# Patient Record
Sex: Female | Born: 1950 | Race: White | Hispanic: No | Marital: Married | Smoking: Never smoker
Health system: Southern US, Community
[De-identification: ages and names within clinical notes are randomized; demographics above are authoritative.]

## PROBLEM LIST (undated history)

## (undated) DIAGNOSIS — I10 Essential (primary) hypertension: Secondary | ICD-10-CM

## (undated) DIAGNOSIS — E119 Type 2 diabetes mellitus without complications: Secondary | ICD-10-CM

## (undated) DIAGNOSIS — E7800 Pure hypercholesterolemia, unspecified: Secondary | ICD-10-CM

## (undated) DIAGNOSIS — M1990 Unspecified osteoarthritis, unspecified site: Secondary | ICD-10-CM

## (undated) DIAGNOSIS — J302 Other seasonal allergic rhinitis: Secondary | ICD-10-CM

## (undated) DIAGNOSIS — G4730 Sleep apnea, unspecified: Secondary | ICD-10-CM

## (undated) HISTORY — DX: Essential (primary) hypertension: I10

## (undated) HISTORY — DX: Unspecified osteoarthritis, unspecified site: M19.90

## (undated) HISTORY — DX: Type 2 diabetes mellitus without complications: E11.9

## (undated) HISTORY — DX: Other seasonal allergic rhinitis: J30.2

## (undated) HISTORY — PX: PARTIAL HYSTERECTOMY: SHX80

## (undated) HISTORY — PX: TUBAL LIGATION: SHX77

## (undated) HISTORY — DX: Sleep apnea, unspecified: G47.30

## (undated) HISTORY — PX: CORONARY ANGIOPLASTY WITH STENT PLACEMENT: SHX49

## (undated) HISTORY — PX: NASAL SINUS SURGERY: SHX719

## (undated) HISTORY — DX: Pure hypercholesterolemia, unspecified: E78.00

---

## 1997-10-14 ENCOUNTER — Other Ambulatory Visit: Admission: RE | Admit: 1997-10-14 | Discharge: 1997-10-14 | Payer: Self-pay | Admitting: *Deleted

## 1997-11-21 ENCOUNTER — Ambulatory Visit (HOSPITAL_BASED_OUTPATIENT_CLINIC_OR_DEPARTMENT_OTHER): Admission: RE | Admit: 1997-11-21 | Discharge: 1997-11-21 | Payer: Self-pay | Admitting: *Deleted

## 1998-07-25 ENCOUNTER — Ambulatory Visit (HOSPITAL_BASED_OUTPATIENT_CLINIC_OR_DEPARTMENT_OTHER): Admission: RE | Admit: 1998-07-25 | Discharge: 1998-07-25 | Payer: Self-pay | Admitting: General Surgery

## 1998-08-08 ENCOUNTER — Emergency Department (HOSPITAL_COMMUNITY): Admission: EM | Admit: 1998-08-08 | Discharge: 1998-08-09 | Payer: Self-pay | Admitting: Emergency Medicine

## 1998-08-08 ENCOUNTER — Encounter: Payer: Self-pay | Admitting: Emergency Medicine

## 1998-08-09 ENCOUNTER — Encounter: Payer: Self-pay | Admitting: Emergency Medicine

## 1999-01-25 ENCOUNTER — Emergency Department (HOSPITAL_COMMUNITY): Admission: EM | Admit: 1999-01-25 | Discharge: 1999-01-25 | Payer: Self-pay | Admitting: *Deleted

## 2007-12-10 ENCOUNTER — Emergency Department (HOSPITAL_BASED_OUTPATIENT_CLINIC_OR_DEPARTMENT_OTHER): Admission: EM | Admit: 2007-12-10 | Discharge: 2007-12-10 | Payer: Self-pay | Admitting: Emergency Medicine

## 2009-01-26 ENCOUNTER — Emergency Department (HOSPITAL_BASED_OUTPATIENT_CLINIC_OR_DEPARTMENT_OTHER): Admission: EM | Admit: 2009-01-26 | Discharge: 2009-01-26 | Payer: Self-pay | Admitting: Emergency Medicine

## 2009-09-28 ENCOUNTER — Encounter: Admission: RE | Admit: 2009-09-28 | Discharge: 2009-09-28 | Payer: Self-pay | Admitting: Specialist

## 2010-08-17 LAB — CBC
Hemoglobin: 12.1 g/dL (ref 12.0–15.0)
MCHC: 35.3 g/dL (ref 30.0–36.0)
RBC: 3.69 MIL/uL — ABNORMAL LOW (ref 3.87–5.11)
WBC: 8 10*3/uL (ref 4.0–10.5)

## 2010-08-17 LAB — DIFFERENTIAL
Lymphocytes Relative: 14 % (ref 12–46)
Lymphs Abs: 1.1 10*3/uL (ref 0.7–4.0)
Monocytes Absolute: 0.7 10*3/uL (ref 0.1–1.0)
Monocytes Relative: 9 % (ref 3–12)
Neutro Abs: 5.8 10*3/uL (ref 1.7–7.7)

## 2010-08-17 LAB — BASIC METABOLIC PANEL
CO2: 22 mEq/L (ref 19–32)
Calcium: 9.6 mg/dL (ref 8.4–10.5)
GFR calc Af Amer: 56 mL/min — ABNORMAL LOW (ref >60)
Sodium: 145 mEq/L (ref 135–145)

## 2010-08-17 LAB — GLUCOSE, CAPILLARY: Glucose-Capillary: 119 mg/dL — ABNORMAL HIGH (ref 70–99)

## 2010-10-09 ENCOUNTER — Encounter (HOSPITAL_COMMUNITY)
Admission: RE | Admit: 2010-10-09 | Discharge: 2010-10-09 | Disposition: A | Discharge status: Home / Self Care | Payer: 59 | Source: Ambulatory Visit | Attending: Orthopedic Surgery | Admitting: Orthopedic Surgery

## 2010-10-09 LAB — DIFFERENTIAL
Lymphocytes Relative: 33 % (ref 12–46)
Monocytes Absolute: 0.3 10*3/uL (ref 0.1–1.0)
Monocytes Relative: 6 % (ref 3–12)
Neutro Abs: 3 10*3/uL (ref 1.7–7.7)
Neutrophils Relative %: 58 % (ref 43–77)

## 2010-10-09 LAB — SURGICAL PCR SCREEN: MRSA, PCR: NEGATIVE

## 2010-10-09 LAB — URINALYSIS, ROUTINE W REFLEX MICROSCOPIC
Bilirubin Urine: NEGATIVE
Glucose, UA: NEGATIVE mg/dL
Hgb urine dipstick: NEGATIVE
Specific Gravity, Urine: 1.015 (ref 1.005–1.030)
pH: 5.5 (ref 5.0–8.0)

## 2010-10-09 LAB — APTT: aPTT: 29 seconds (ref 24–37)

## 2010-10-09 LAB — BASIC METABOLIC PANEL
CO2: 23 mEq/L (ref 19–32)
Calcium: 9.4 mg/dL (ref 8.4–10.5)
Chloride: 106 mEq/L (ref 96–112)
Creatinine, Ser: 1.16 mg/dL (ref 0.4–1.2)
GFR calc Af Amer: 58 mL/min — ABNORMAL LOW (ref >60)
Glucose, Bld: 131 mg/dL — ABNORMAL HIGH (ref 70–99)
Sodium: 140 mEq/L (ref 135–145)

## 2010-10-09 LAB — CBC
HCT: 34.1 % — ABNORMAL LOW (ref 36.0–46.0)
Hemoglobin: 11.4 g/dL — ABNORMAL LOW (ref 12.0–15.0)
MCH: 30.1 pg (ref 26.0–34.0)
MCHC: 33.4 g/dL (ref 30.0–36.0)
RBC: 3.79 MIL/uL — ABNORMAL LOW (ref 3.87–5.11)

## 2010-10-09 LAB — PROTIME-INR: INR: 0.96 (ref 0.00–1.49)

## 2010-10-10 ENCOUNTER — Ambulatory Visit (HOSPITAL_COMMUNITY): Payer: 59

## 2010-10-10 ENCOUNTER — Ambulatory Visit (HOSPITAL_COMMUNITY)
Admission: RE | Admit: 2010-10-10 | Discharge: 2010-10-12 | DRG: 497 | Disposition: A | Discharge status: Home / Self Care | Payer: 59 | Source: Ambulatory Visit | Attending: Orthopedic Surgery | Admitting: Orthopedic Surgery

## 2010-10-10 DIAGNOSIS — I10 Essential (primary) hypertension: Secondary | ICD-10-CM | POA: Insufficient documentation

## 2010-10-10 DIAGNOSIS — Z01812 Encounter for preprocedural laboratory examination: Secondary | ICD-10-CM | POA: Insufficient documentation

## 2010-10-10 DIAGNOSIS — G4733 Obstructive sleep apnea (adult) (pediatric): Secondary | ICD-10-CM | POA: Insufficient documentation

## 2010-10-10 DIAGNOSIS — K219 Gastro-esophageal reflux disease without esophagitis: Secondary | ICD-10-CM | POA: Insufficient documentation

## 2010-10-10 DIAGNOSIS — E669 Obesity, unspecified: Secondary | ICD-10-CM | POA: Insufficient documentation

## 2010-10-10 DIAGNOSIS — E119 Type 2 diabetes mellitus without complications: Secondary | ICD-10-CM | POA: Insufficient documentation

## 2010-10-10 DIAGNOSIS — S43439A Superior glenoid labrum lesion of unspecified shoulder, initial encounter: Secondary | ICD-10-CM | POA: Insufficient documentation

## 2010-10-10 DIAGNOSIS — X58XXXA Exposure to other specified factors, initial encounter: Secondary | ICD-10-CM | POA: Insufficient documentation

## 2010-10-10 DIAGNOSIS — Z794 Long term (current) use of insulin: Secondary | ICD-10-CM | POA: Insufficient documentation

## 2010-10-10 DIAGNOSIS — Z9861 Coronary angioplasty status: Secondary | ICD-10-CM | POA: Insufficient documentation

## 2010-10-10 DIAGNOSIS — I2510 Atherosclerotic heart disease of native coronary artery without angina pectoris: Secondary | ICD-10-CM | POA: Insufficient documentation

## 2010-10-10 DIAGNOSIS — S43429A Sprain of unspecified rotator cuff capsule, initial encounter: Secondary | ICD-10-CM | POA: Insufficient documentation

## 2010-10-10 DIAGNOSIS — M19019 Primary osteoarthritis, unspecified shoulder: Secondary | ICD-10-CM | POA: Insufficient documentation

## 2010-10-10 LAB — GLUCOSE, CAPILLARY
Glucose-Capillary: 74 mg/dL (ref 70–99)
Glucose-Capillary: 145 mg/dL — ABNORMAL HIGH (ref 70–99)
Glucose-Capillary: 113 mg/dL — ABNORMAL HIGH (ref 70–99)

## 2010-10-11 LAB — CBC
Hemoglobin: 10.2 g/dL — ABNORMAL LOW (ref 12.0–15.0)
Platelets: 219 10*3/uL (ref 150–400)
RBC: 3.45 MIL/uL — ABNORMAL LOW (ref 3.87–5.11)
WBC: 6 10*3/uL (ref 4.0–10.5)

## 2010-10-11 LAB — BASIC METABOLIC PANEL
CO2: 22 mEq/L (ref 19–32)
Chloride: 104 mEq/L (ref 96–112)
GFR calc Af Amer: >60 mL/min (ref >60)
Potassium: 3.8 mEq/L (ref 3.5–5.1)
Sodium: 135 mEq/L (ref 135–145)

## 2010-10-11 LAB — GLUCOSE, CAPILLARY
Glucose-Capillary: 102 mg/dL — ABNORMAL HIGH (ref 70–99)
Glucose-Capillary: 85 mg/dL (ref 70–99)
Glucose-Capillary: 82 mg/dL (ref 70–99)
Glucose-Capillary: 167 mg/dL — ABNORMAL HIGH (ref 70–99)
Glucose-Capillary: 117 mg/dL — ABNORMAL HIGH (ref 70–99)

## 2010-10-12 LAB — GLUCOSE, CAPILLARY: Glucose-Capillary: 116 mg/dL — ABNORMAL HIGH (ref 70–99)

## 2010-11-30 NOTE — Discharge Summary (Signed)
  NAME:  [name], [name] NO.:  [id number]  MEDICAL RECORD NO.:  [id number]           PATIENT TYPE:  I  LOCATION:  5004                         FACILITY:  [facility]  PHYSICIAN:  Almedia Balls. Ranell Patrick, M.D. DATE OF BIRTH:  02-Jan-1951  DATE OF ADMISSION:  10/10/2010 DATE OF DISCHARGE:  10/11/2010                              DISCHARGE SUMMARY   ADMISSION DIAGNOSIS:  Right shoulder pain secondary to rotator cuff tear.  DISCHARGE DIAGNOSIS:  Right shoulder pain secondary to rotator cuff tear, status post open repair.  BRIEF HISTORY:  The patient is a 60 year old female with worsening right shoulder pain secondary to right rotator cuff tear and SLAP lesion.  The patient is elected for surgical management, decrease pain and increase function.  PROCEDURE:  The patient had a right shoulder arthroscopy, rotator cuff repair along with biceps tenodesis by Dr. Malon Kindle on Oct 10, 2010. Assistant was [employer], general anesthesia was used.  No complications.  HOSPITAL COURSE:  The patient admitted on Oct 10, 2010, for the above- stated procedure which she tolerated well.  After adequate time in Post Anesthesia Care Unit, she was transferred to 5000.  Postop day #1, the patient complained about moderate nausea and block was still actually working quite well on the morning, but her pain was under adequate control, otherwise she was tolerating postsurgical time fairly well. She worked with physical therapy before discharge and was discharged home on postop day #1.  DISCHARGE/PLAN:  The patient will be discharged home on Oct 11, 2010. Her condition is stable.  Her diet is regular.  DISCHARGE MEDICATIONS: 1. Percocet 5/325 one to two tabs q.4-6 hours p.r.n. pain. 2. Robaxin 500 mg p.o. q.6 h. 3. Aspirin 81 mg daily. 4. Symbicort 2 puffs inhaled b.i.d. 5. Carvedilol 25 mg b.i.d. 6. Flonase 2 sprays nightly. 7. Hydralazine 25 mg b.i.d. insulin per sliding scale. 8.  Isosorbide mononitrate 60 mg daily. 9. Cozaar 100 mg daily. 10.Metformin 1000 mg b.i.d. 11.Robaxin 500 mg q.6. 12.Protonix 40 mg daily. 13.Crestor 10 mg nightly. 14.Spironolactone 25 mg daily.  FOLLOWUP:  The patient is to follow back up with Dr. Malon Kindle in 2 weeks.     Thomas B. Dixon, P.A.   ______________________________ Almedia Balls. Ranell Patrick, M.D.    TBD/MEDQ  D:  10/11/2010  T:  10/11/2010  Job:  [id number]  Electronically Signed by Standley Dakins P.A. on 11/06/2010 08:31:35 AM Electronically Signed by Malon Kindle  on 11/30/2010 12:04:32 AM

## 2010-11-30 NOTE — Op Note (Signed)
NAME:  [name] NO.:  [id number]  MEDICAL RECORD NO.:  [id number]           PATIENT TYPE:  I  LOCATION:  5004                         FACILITY:  [facility]  PHYSICIAN:  [name], M.D. DATE OF BIRTH:  1950-12-22  DATE OF PROCEDURE:  10/10/2010 DATE OF DISCHARGE:                              OPERATIVE REPORT   PREOPERATIVE DIAGNOSES:  Right shoulder rotator cuff tear, SLAP lesion, and AC joint arthritis.  POSTOPERATIVE DIAGNOSES:  Right shoulder rotator cuff tear, SLAP lesion, and AC joint arthritis.  PROCEDURE PERFORMED:  Right shoulder arthroscopy with extensive intraarticular debridement including debridement of torn superior labrum, anterior to posterior arthroscopic biceps tenotomy, arthroscopic subacromial decompression, and acromioplasty followed by mini open rotator cuff repair, open biceps tenodesis in the groove, and open distal clavicle resection.  SURGEON:  Karen Balls. Ranell Patrick, MD  ASSISTANT:  [name]. Dixon, PA-C  General anesthesia was used plus interscalene block.  ESTIMATED BLOOD LOSS:  Minimal.  FLUID REPLACEMENT:  1200 mL crystalloids.  COUNTS:  Correct.  There were no complications.  Preoperative antibiotics were given.  INDICATIONS:  The patient is a 60 year old female with worsening right shoulder pain secondary to MRI-documented torn rotator cuff.  The patient also has advanced degenerative changes in the superior labrum concerning for tearing and also AC joint arthritis without impingement on the rotator cuff.  Having failed conservative management desiring operative treatment, the patient has elected to proceed with surgery to restore function and limit pain to her shoulder.  Informed consent was obtained.  DESCRIPTION OF PROCEDURE:  After an adequate level of anesthesia achieved, the patient was positioned in modified beach-chair position. Right shoulder exam under anesthesia, full passive range of motion  was noted with no undue stiffness and no instability.  A sterile prep and drape was performed of the right shoulder.  Time-out was called.  Right shoulder was then entered arthroscopically using standard portals including anterior, posterior, and lateral portals.  Identified a severely torn rotator cuff involving supraspinatus, infraspinatus tendons, retracted to the mid joint.  We also noted there to be degenerative superior labral tear anterior to posterior with unstable biceps.  We performed a biceps tenotomy and labral debridement back to stable labral rim.  Articular cartilage in the glenohumeral joint showed minimal degenerative changes.  Subscapularis appeared fairly normal. The rotator cuff was torn as mentioned.  Posterior cuff in the teres minor area and part of the infraspinatus was intact.  We then placed the scope in subacromial space.  A thorough bursectomy and acromioplasty was performed creating a type 1 acromial shape with attention towards removing the lateral overhang.  Once the rotator cuff decompression was complete, we inspected the cuff again.  This was a fairly degenerative looking tear, wide at least 2-3 cm in length and retraction of the mid joint.  We then went ahead to conclude the arthroscopic portion of surgery, made a saber incision overlying the AC joint.  Dissection down through subcu tissues using Bovie.  Subperiosteal dissection distal clavicle followed and that was splitting in deltotrapezial fascia lying within the clavicle.  Once we  had subperiosteally dissected, we removed about 3 mm of distal clavicle using oscillating saw.  Thoroughly irrigated the [facility] interval followed by application of bone wax cutting the clavicle.  We then went ahead and repaired the deltotrapezial fascia with 0 Vicryl figure-of-eight interrupted suture followed by 2-0 Vicryl subcutaneous closure and 4-0 Monocryl for skin.  We then addressed rotator cuff and biceps to a single  mini open incision, starting at the anterolateral border of the acromion staying distally about 3 to 4 cm. Dissection down through subcutaneous tissue, split the deltoid line with its fibers and between the anterolateral heads.  We identified the biceps tendon.  We retracted that on tension and then went ahead and placed a single zero #2 FiberWire suture through the biceps and through the soft tissue in a figure-of-eight stitch to obtain a descent soft tissue tenodesis on tension.  We then removed the remaining tendon. Next, we went ahead through rest of rotator cuff tear.  This was a U- shaped tear.  We went ahead and performed a repair of this.  We mobilized the tendon, freshened up the rotator cuff footprint, placed two 5.5 PEEK Bio-Corkscrew anchors by [employer] at the articular margin, double loaded with #2 FiberWire suture.  We anchored the anterior and posterior corners with #2 FiberWire simple suture.  Once we had brought that tendon back in anatomic position and tied those suture anchor sutures, we additionally took a mattress suture over the top of the entire repair down to 2 push locks out laterally.  We used some of the suture material that we had tied from the suture anchors and brought that down over the top the lateral footprint with spreading these sutures.  We had nice application of the lateral portion of the rotator cuff footprint.  We also did some sutures through bone as her bone was very soft with 0 Vicryl suture, using a wedge suture needle to dry the suture through the bone, and we had basically an anatomic repair, not under tension that did not gap at all through a full range of motion nor did it impinge.  We thoroughly irrigated the repair site.  We were very pleased with how that turned out, took my finger and rubbed it smoothly over the repair area rotating the humeral head under my finger and again we almost could not tell the repaired area from the normal area  in terms of the way it felt and was palpated.  At this point, we went ahead and thoroughly irrigated and closed the deltoid to itself with 0 Vicryl suture followed by 2-0 Vicryl for subcutaneous closure and 4-0 Monocryl for skin and portals.  Steri-Strips applied followed by sterile dressing and shoulder sling.     [name], M.D.     SRN/MEDQ  D:  10/10/2010  T:  10/11/2010  Job:  [id number]  Electronically Signed by Malon Kindle  on 11/30/2010 12:04:29 AM

## 2014-08-26 ENCOUNTER — Ambulatory Visit (INDEPENDENT_AMBULATORY_CARE_PROVIDER_SITE_OTHER): Payer: Medicare Other | Admitting: Internal Medicine

## 2014-08-26 ENCOUNTER — Encounter: Payer: Self-pay | Admitting: Internal Medicine

## 2014-08-26 VITALS — BP 144/98 | HR 89 | Ht 64.0 in | Wt 249.0 lb

## 2014-08-26 DIAGNOSIS — M069 Rheumatoid arthritis, unspecified: Secondary | ICD-10-CM | POA: Diagnosis not present

## 2014-08-26 DIAGNOSIS — R0600 Dyspnea, unspecified: Secondary | ICD-10-CM | POA: Diagnosis not present

## 2014-08-26 DIAGNOSIS — R0689 Other abnormalities of breathing: Secondary | ICD-10-CM

## 2014-08-26 NOTE — Progress Notes (Signed)
Subjective:    Patient ID: [name], female    DOB: [birth year], 64 y.o.   MRN: [id number] PCP Mariel Sleet, PA-C  Referred by Dr. Zenovia Jordan rheumatology [facility]  HPI  IOV 08/26/2014  Chief Complaint  Patient presents with  . Pulmonary consult    Pt c/o left sided chest pain, chest tightness/restriction, SOB with activity. Denies any wheezing, cough. Uses CPAP nightly.  Heart Cath done on 07/20/14.   64 year old obese female with rheumatoid arthritis. Referred for dyspnea. Reports a diagnosis of rheumatoid arthritis in 2013/2014. Since then she's been on methotrexate weekly and TNF alpha blockade. Reporting insidious onset of shortness of breath for the last few to several months. It is moderate in severity. In fact when she walked 600 feet in office she got extremely dyspneic. However she did not desaturate. Dyspnea relieved by rest. No associated cough or wheezing or chest pain although there is some random chest tightness. She did undergo extensive cardiac workup including echocardiogram nuclear medicine stress test test and left heart catheterization by her cardiologist in [facility] and apparently all this was noncontributory. It does not look like she had a right heart catheterization.   Walking desaturation test 185 feet 3 laps. Resting heart rate was 82/m peak heart rate was 111/m. She did not desaturate below 97%.    has a past medical history of High blood pressure; Diabetes; High cholesterol; Seasonal allergies; Sleep apnea; and Osteoarthritis.   reports that she has never smoked. She does not have any smokeless tobacco history on file.  Past Surgical History  Procedure Laterality Date  . Nasal sinus surgery    . Coronary angioplasty with stent placement    . Partial hysterectomy    . Tubal ligation      Allergies  Allergen Reactions  . Butorphanol Anaphylaxis  . Benazepril Hcl     Other reaction(s): Cough Per pt,  Lotensin never gave anaphylaxis, only a persistent cough, record updated to reflect this.  . Meloxicam     Bone marrow insufficiency  . Morphine And Related Itching    Immunization History  Administered Date(s) Administered  . Influenza Split 05/13/2014    Family History  Problem Relation Age of Onset  . Heart disease Mother   . Breast cancer Mother   . Lung cancer Brother      Current outpatient prescriptions:  .  aspirin 325 MG tablet, Take 325 mg by mouth daily., Disp: , Rfl:  .  atorvastatin (LIPITOR) 20 MG tablet, Take 20 mg by mouth daily., Disp: , Rfl:  .  Beclomethasone Dipropionate 80 MCG/ACT AERS, Place 1 spray into the nose., Disp: , Rfl:  .  carvedilol (COREG) 25 MG tablet, Take 25 mg by mouth 2 (two) times daily., Disp: , Rfl:  .  cetirizine (ZYRTEC) 5 MG tablet, Take 10 mg by mouth daily., Disp: , Rfl:  .  cloNIDine (CATAPRES) 0.1 MG tablet, Take 0.1 mg by mouth., Disp: , Rfl:  .  etanercept (ENBREL) 50 MG/ML injection, 50 mg., Disp: , Rfl:  .  fenofibrate micronized (LOFIBRA) 134 MG capsule, Take 134 mg by mouth., Disp: , Rfl:  .  folic acid (FOLVITE) 1 MG tablet, Take 1 mg by mouth., Disp: , Rfl:  .  hydrALAZINE (APRESOLINE) 25 MG tablet, TAKE 1 TABLET (25 MG TOTAL) BY MOUTH 3 (THREE) TIMES A DAY., Disp: , Rfl:  .  Insulin Glargine (LANTUS) 100 UNIT/ML Solostar Pen, 15 units nightly, Disp: ,  Rfl:  .  isosorbide mononitrate (IMDUR) 60 MG 24 hr tablet, Take 60 mg by mouth., Disp: , Rfl:  .  losartan (COZAAR) 100 MG tablet, Take 100 mg by mouth., Disp: , Rfl:  .  metFORMIN (GLUCOPHAGE) 1000 MG tablet, Take 1,000 mg by mouth., Disp: , Rfl:  .  Methotrexate Sodium (METHOTREXATE, PF,) 50 MG/2ML injection, by Intrathecal route., Disp: , Rfl:  .  mometasone-formoterol (DULERA) 100-5 MCG/ACT AERO, 2 puffs., Disp: , Rfl:  .  nabumetone (RELAFEN) 500 MG tablet, Take 500 mg by mouth., Disp: , Rfl:  .  omeprazole (PRILOSEC) 20 MG capsule, Take 20 mg by mouth., Disp: , Rfl:  .   potassium chloride SA (K-DUR,KLOR-CON) 20 MEQ tablet, Take 20 mEq by mouth., Disp: , Rfl:  .  spironolactone (ALDACTONE) 25 MG tablet, Take 25 mg by mouth., Disp: , Rfl:     Review of Systems  Constitutional: Positive for unexpected weight change. Negative for fever.  HENT: Positive for congestion, sneezing and sore throat. Negative for dental problem, ear pain, nosebleeds, postnasal drip, rhinorrhea, sinus pressure and trouble swallowing.   Eyes: Positive for itching. Negative for redness.  Respiratory: Positive for chest tightness, shortness of breath and wheezing. Negative for cough.   Cardiovascular: Positive for leg swelling. Negative for palpitations.  Gastrointestinal: Negative for nausea and vomiting.  Genitourinary: Negative for dysuria.  Musculoskeletal: Negative for joint swelling.  Skin: Negative for rash.  Neurological: Positive for headaches.  Hematological: Does not bruise/bleed easily.  Psychiatric/Behavioral: Negative for dysphoric mood. The patient is not nervous/anxious.        Objective:   Physical Exam  Constitutional: She is oriented to person, place, and time. She appears well-developed and well-nourished. No distress.  Body mass index is 42.72 kg/(m^2).   HENT:  Head: Normocephalic and atraumatic.  Right Ear: External ear normal.  Left Ear: External ear normal.  Mouth/Throat: Oropharynx is clear and moist. No oropharyngeal exudate.  Eyes: Conjunctivae and EOM are normal. Pupils are equal, round, and reactive to light. Right eye exhibits no discharge. Left eye exhibits no discharge. No scleral icterus.  Neck: Normal range of motion. Neck supple. No JVD present. No tracheal deviation present. No thyromegaly present.  Cardiovascular: Normal rate, regular rhythm, normal heart sounds and intact distal pulses.  Exam reveals no gallop and no friction rub.   No murmur heard. Pulmonary/Chest: Effort normal and breath sounds normal. No respiratory distress. She has  no wheezes. She has no rales. She exhibits no tenderness.  Abdominal: Soft. Bowel sounds are normal. She exhibits no distension and no mass. There is no tenderness. There is no rebound and no guarding.  Visceral obesity +  Musculoskeletal: Normal range of motion. She exhibits no edema or tenderness.  Uses cane OA and RA of hands joints  Lymphadenopathy:    She has no cervical adenopathy.  Neurological: She is alert and oriented to person, place, and time. She has normal reflexes. No cranial nerve deficit. She exhibits normal muscle tone. Coordination normal.  Skin: Skin is warm and dry. No rash noted. She is not diaphoretic. No erythema. No pallor.  Psychiatric: She has a normal mood and affect. Her behavior is normal. Judgment and thought content normal.  Vitals reviewed.   Filed Vitals:   08/26/14 1448  BP: 144/98  Pulse: 89  Height: 5\' 4"  (1.626 m)  Weight: 249 lb (112.946 kg)  SpO2: 97%         Assessment & Plan:     ICD-9-CM  ICD-10-CM   1. Dyspnea and respiratory abnormality 786.09 R06.00     R06.89   2. Rheumatoid arthritis 714.0 M06.9    In the setting of rheumatoid arthritis and methotrexate use and TNF alpha blockade use: Rule out interstitial lung disease, opportunistic infection, obstructive lung disease and pulmonary hypertension  Do High Resolution CT chest without contrast on ILD protocol.  Do full PFT Sign release to get your echo record from [facility] cardiologist  followup  - return to see me/NP next 3 weeks after above completion  Dr. Kalman Shan, M.D., [facility].C.P Pulmonary and Critical Care Medicine Staff Physician [facility] [place] [facility] Pager: [phone], If no answer or between  15:00h - 7:00h: call [phone]  08/26/2014 3:36 PM

## 2014-08-26 NOTE — Patient Instructions (Signed)
ICD-9-CM ICD-10-CM   1. Dyspnea and respiratory abnormality 786.09 R06.00     R06.89   2. Rheumatoid arthritis 714.0 M06.9    Do High Resolution CT chest without contrast on ILD protocol.  Do full PFT Sign release to get your echo record from [facility] cardiologist  followup  - return to see me/NP next 3 weeks after above completion

## 2014-09-01 ENCOUNTER — Ambulatory Visit (HOSPITAL_COMMUNITY)
Admission: RE | Admit: 2014-09-01 | Discharge: 2014-09-01 | Disposition: A | Discharge status: Home / Self Care | Payer: Medicare Other | Source: Ambulatory Visit | Attending: Internal Medicine | Admitting: Internal Medicine

## 2014-09-01 ENCOUNTER — Ambulatory Visit (INDEPENDENT_AMBULATORY_CARE_PROVIDER_SITE_OTHER)
Admission: RE | Admit: 2014-09-01 | Discharge: 2014-09-01 | Disposition: A | Discharge status: Home / Self Care | Payer: Medicare Other | Source: Ambulatory Visit | Attending: Internal Medicine | Admitting: Internal Medicine

## 2014-09-01 DIAGNOSIS — R0689 Other abnormalities of breathing: Secondary | ICD-10-CM | POA: Insufficient documentation

## 2014-09-01 DIAGNOSIS — R0600 Dyspnea, unspecified: Secondary | ICD-10-CM | POA: Diagnosis not present

## 2014-09-01 LAB — PULMONARY FUNCTION TEST
DL/VA % PRED: 105 %
DL/VA: 4.81 ml/min/mmHg/L
DLCO UNC: 21.15 ml/min/mmHg
DLCO unc % pred: 98 %
FEF 25-75 PRE: 3.24 L/s
FEF2575-%Pred-Pre: 156 %
FEV1-%Pred-Pre: 117 %
FEV1-PRE: 2.65 L
FEV1FVC-%Pred-Pre: 111 %
FEV6-%PRED-PRE: 108 %
FEV6-PRE: 3.08 L
FEV6FVC-%PRED-PRE: 103 %
FVC-%PRED-PRE: 104 %
FVC-Pre: 3.09 L
PRE FEV6/FVC RATIO: 100 %
Pre FEV1/FVC ratio: 86 %
RV % PRED: 87 %
RV: 1.72 L
TLC % pred: 99 %
TLC: 4.75 L

## 2014-09-01 MED ORDER — ALBUTEROL SULFATE (2.5 MG/3ML) 0.083% IN NEBU
2.5000 mg | INHALATION_SOLUTION | Freq: Once | RESPIRATORY_TRACT | Status: AC
  Administered 2014-09-01: 2.5 mg via RESPIRATORY_TRACT

## 2014-09-04 ENCOUNTER — Telehealth: Payer: Self-pay | Admitting: Internal Medicine

## 2014-09-04 NOTE — Telephone Encounter (Signed)
PFT normal 09/01/14  CT chest - NO ILD . Only mild bronchiectasis - does not even show up in significance in PFT  Dyspnea unexplained   - get echo and cath reports from cardiologist in [place] -  Keep fu with TP  - might need rehab

## 2014-09-05 NOTE — Telephone Encounter (Signed)
ATC pt. Line would not connect-- stating the area code needs to be dialed even after dialing the area code. Will try and cal back later.

## 2014-09-07 NOTE — Telephone Encounter (Signed)
lmtcb for pt.  

## 2014-09-08 NOTE — Telephone Encounter (Signed)
Pt returning call  [phone]

## 2014-09-08 NOTE — Telephone Encounter (Signed)
Called and spoke to pt. Informed her of the results and recs per MR. The cardiac cath report is in care everywhere, the 2D echo is not. Requested both results to be faxed to the office. Pt verbalized understanding and denied any further questions or concerns at this time.

## 2014-09-16 ENCOUNTER — Encounter: Payer: Self-pay | Admitting: Adult Health

## 2014-09-16 ENCOUNTER — Ambulatory Visit (INDEPENDENT_AMBULATORY_CARE_PROVIDER_SITE_OTHER): Payer: Medicare Other | Admitting: Adult Health

## 2014-09-16 VITALS — BP 128/80 | HR 85 | Temp 98.4°F | Ht 64.0 in | Wt 249.6 lb

## 2014-09-16 DIAGNOSIS — G4733 Obstructive sleep apnea (adult) (pediatric): Secondary | ICD-10-CM | POA: Diagnosis not present

## 2014-09-16 DIAGNOSIS — R0689 Other abnormalities of breathing: Secondary | ICD-10-CM

## 2014-09-16 DIAGNOSIS — R0600 Dyspnea, unspecified: Secondary | ICD-10-CM | POA: Diagnosis not present

## 2014-09-16 NOTE — Patient Instructions (Addendum)
Continue on Dulera 2 puffs Twice daily  , rinse after use.  Will get echo results.  Stay on CPAP At bedtime   Weight loss.  Follow up Dr. Marchelle Gearing in 3 months and As needed   Please contact office for sooner follow up if symptoms do not improve or worsen or seek emergency care

## 2014-09-23 DIAGNOSIS — G4733 Obstructive sleep apnea (adult) (pediatric): Secondary | ICD-10-CM | POA: Insufficient documentation

## 2014-09-23 NOTE — Assessment & Plan Note (Addendum)
?   Asthma, PFT w/ normal lung function  Plan  Continue on Dulera 2 puffs Twice daily  , rinse after use.  Follow up Dr. Marchelle Gearing in 3 months and As needed   Please contact office for sooner follow up if symptoms do not improve or worsen or seek emergency care

## 2014-09-23 NOTE — Assessment & Plan Note (Signed)
Stay on CPAP At bedtime   Weight loss.  Follow up Dr. Marchelle Gearing in 3 months and As needed   Please contact office for sooner follow up if symptoms do not improve or worsen or seek emergency care

## 2014-09-23 NOTE — Progress Notes (Signed)
Subjective:    Patient ID: [name], female    DOB: 17-May-1950, 64 y.o.   MRN: [id number] PCP Mariel Sleet, PA-C  Referred by Dr. Zenovia Jordan rheumatology [facility]  HPI  IOV 08/26/2014  Chief Complaint  Patient presents with  . Pulmonary consult    Pt c/o left sided chest pain, chest tightness/restriction, SOB with activity. Denies any wheezing, cough. Uses CPAP nightly.  Heart Cath done on 07/20/14.   64 year old obese female with rheumatoid arthritis. Referred for dyspnea. Reports a diagnosis of rheumatoid arthritis in 2013/2014. Since then she's been on methotrexate weekly and TNF alpha blockade. Reporting insidious onset of shortness of breath for the last few to several months. It is moderate in severity. In fact when she walked 600 feet in office she got extremely dyspneic. However she did not desaturate. Dyspnea relieved by rest. No associated cough or wheezing or chest pain although there is some random chest tightness. She did undergo extensive cardiac workup including echocardiogram nuclear medicine stress test test and left heart catheterization by her cardiologist in [facility] and apparently all this was noncontributory. It does not look like she had a right heart catheterization.   Walking desaturation test 185 feet 3 laps. Resting heart rate was 82/m peak heart rate was 111/m. She did not desaturate below 97%.    has a past medical history of High blood pressure; Diabetes; High cholesterol; Seasonal allergies; Sleep apnea; and Osteoarthritis.   reports that she has never smoked. She does not have any smokeless tobacco history on file.  09/16/14 Follow up  Patient returns for two-week follow-up For initial OV last visit for dyspnea. High resolution CT chest showed no interstitial lung disease, mild bronchiectasis, greatest at the left lower lobe. Spirometry on April 22 showed an FEV1 at 117%, ratio of 86, FVC at 104%,  diffusing capacity at 98%. We discussed these results. She remains on Dulera.   reports breathing is doing well, no new complaints.   Past Surgical History  Procedure Laterality Date  . Nasal sinus surgery    . Coronary angioplasty with stent placement    . Partial hysterectomy    . Tubal ligation      Allergies  Allergen Reactions  . Butorphanol Anaphylaxis  . Benazepril Hcl     Other reaction(s): Cough Per pt, Lotensin never gave anaphylaxis, only a persistent cough, record updated to reflect this.  . Meloxicam     Bone marrow insufficiency  . Morphine And Related Itching    Immunization History  Administered Date(s) Administered  . Influenza Split 05/13/2014    Family History  Problem Relation Age of Onset  . Heart disease Mother   . Breast cancer Mother   . Lung cancer Brother     Review of Systems   Constitutional:   No  weight loss, night sweats,  Fevers, chills, fatigue, or  lassitude.  HEENT:   No headaches,  Difficulty swallowing,  Tooth/dental problems, or  Sore throat,                No sneezing, itching, ear ache, nasal congestion, post nasal drip,   CV:  No chest pain,  Orthopnea, PND, swelling in lower extremities, anasarca, dizziness, palpitations, syncope.   GI  No heartburn, indigestion, abdominal pain, nausea, vomiting, diarrhea, change in bowel habits, loss of appetite, bloody stools.   Resp:    No chest wall deformity  Skin: no rash or lesions.  GU: no dysuria,  change in color of urine, no urgency or frequency.  No flank pain, no hematuria   MS:  No joint pain or swelling.  No decreased range of motion.  No back pain.  Psych:  No change in mood or affect. No depression or anxiety.  No memory loss.          Objective:   Physical Exam   .   HENT:  Head: Normocephalic and atraumatic.  Right Ear: External ear normal.  Left Ear: External ear normal.  Mouth/Throat: Oropharynx is clear and moist. No oropharyngeal exudate.  Eyes:  Conjunctivae and EOM are normal. Pupils are equal, round, and reactive to light. Right eye exhibits no discharge. Left eye exhibits no discharge. No scleral icterus.  Neck: Normal range of motion. Neck supple. No JVD present. No tracheal deviation present. No thyromegaly present.  Cardiovascular: Normal rate, regular rhythm, normal heart sounds and intact distal pulses.  Exam reveals no gallop and no friction rub.   No murmur heard. Pulmonary/Chest: Effort normal and breath sounds normal. No respiratory distress. She has no wheezes. She has no rales. She exhibits no tenderness.  Abdominal: Soft. Bowel sounds are normal. She exhibits no distension and no mass. There is no tenderness. There is no rebound and no guarding.  Visceral obesity +  Musculoskeletal: Normal range of motion. She exhibits no edema or tenderness.  Uses cane OA and RA of hands joints  Lymphadenopathy:    She has no cervical adenopathy.  Neurological: She is alert and oriented to person, place, and time. She has normal reflexes. No cranial nerve deficit. She exhibits normal muscle tone. Coordination normal.  Skin: Skin is warm and dry. No rash noted. She is not diaphoretic. No erythema. No pallor.  Psychiatric: She has a normal mood and affect. Her behavior is normal. Judgment and thought content normal.  Vitals reviewed.

## 2014-10-03 NOTE — Telephone Encounter (Signed)
[facility]  - echo  - ModLVH, ef > 55%, diast dysfn+ ?? Grade - 3.15.16  - cath 07/18/14- nearly normal  Dr. Kalman Shan, M.D., [facility].C.P Pulmonary and Critical Care Medicine Staff Physician [facility] Pager: [phone], If no answer or between  15:00h - 7:00h: call [phone]  10/03/2014 5:54 AM

## 2017-01-19 IMAGING — CT CT CHEST HIGH RESOLUTION W/O CM
3 of 7 series · 13 of 36 positions shown, 14 images · non-contrast
Comparison: No priors.

CLINICAL DATA: 63-year-old female with shortness of breath
worsening over the past several months. Mid chest pain. Evaluate for
interstitial lung disease.

EXAM:
CT CHEST WITHOUT CONTRAST
TECHNIQUE: Multidetector CT imaging of the chest was performed following the
standard protocol without intravenous contrast. High resolution
imaging of the lungs, as well as inspiratory and expiratory imaging,
was performed.

[Series 5: lung · axial · 0.70mm/px · z∈[-285,-65]mm · 7 of 60 slices shown]
[im 8/60  lung]
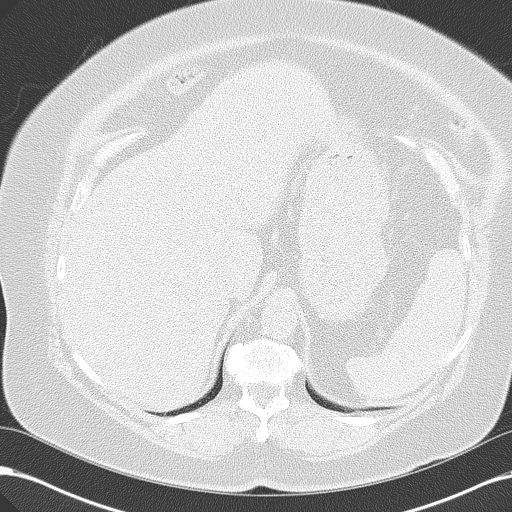
[im 15/60  lung]
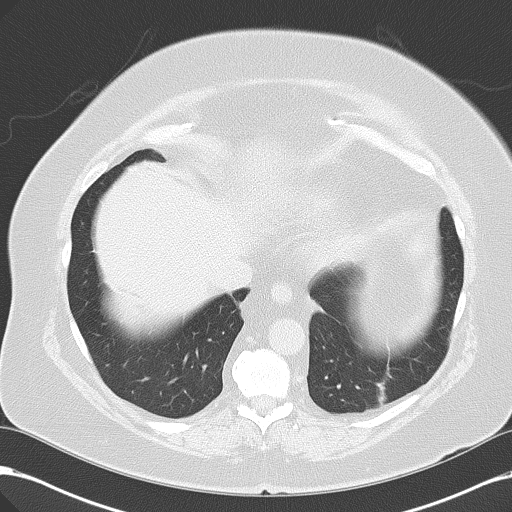
[im 23/60  lung]
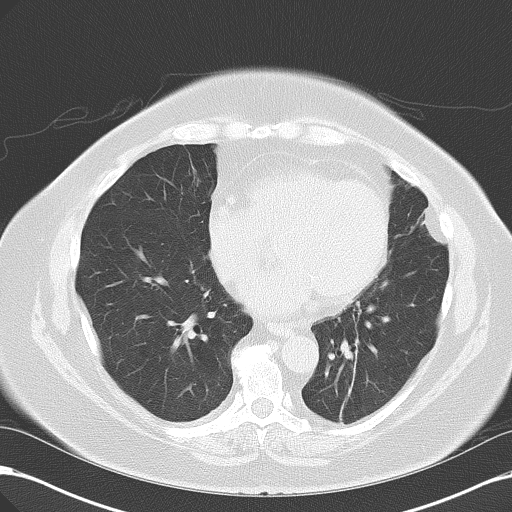
[im 30/60  lung]
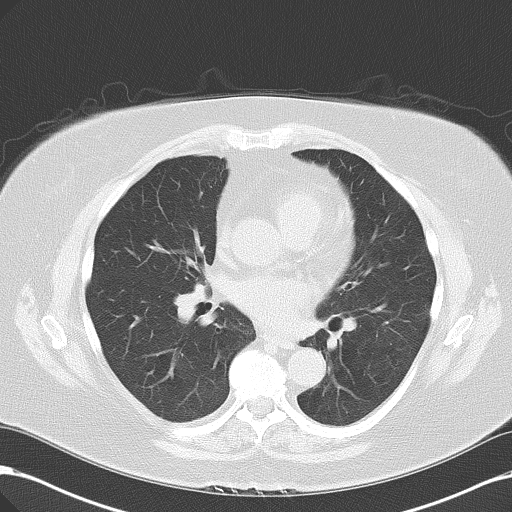
[im 37/60  lung]
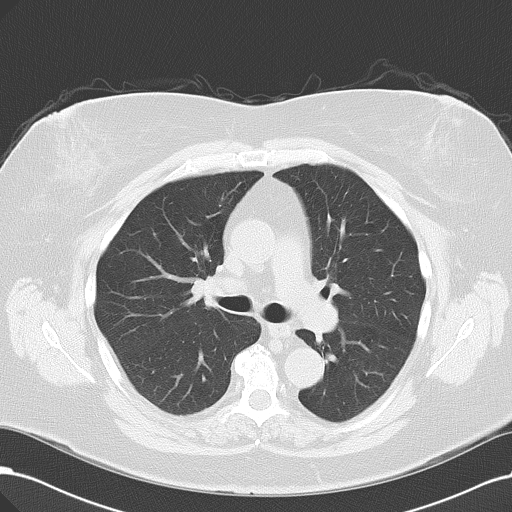
[im 45/60  lung]
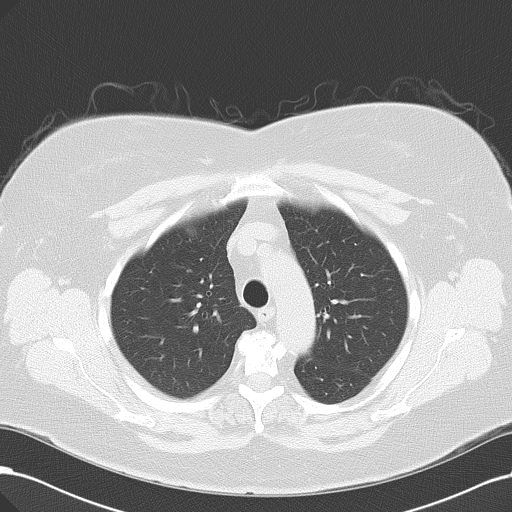
[im 52/60  lung]
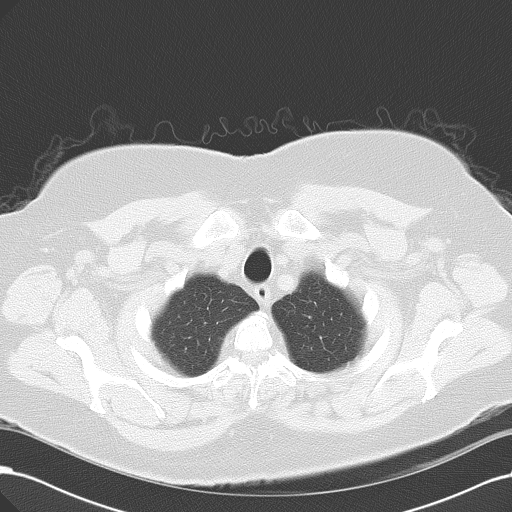

[Series 8: hires retro entire lungs · axial · 0.70mm/px · z∈[-250,-100]mm · 3 of 31 slices shown, 4 images]
[im 8/31  mediastinal]
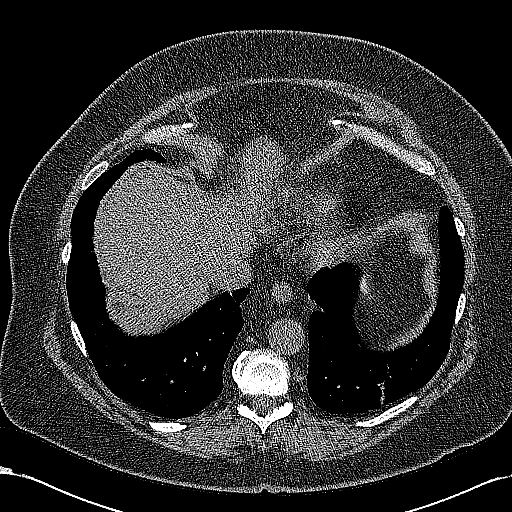
[im 8/31  lung]
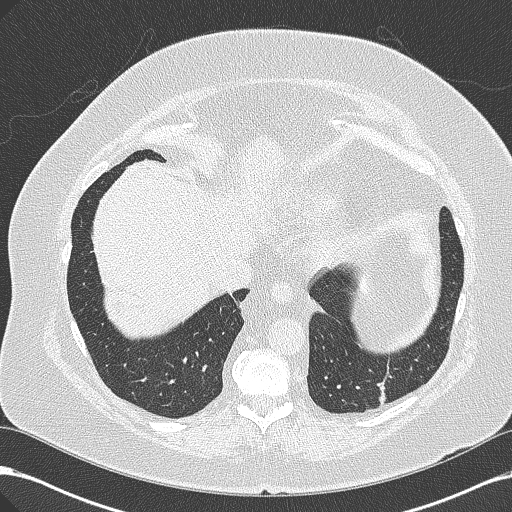
[im 16/31  lung]
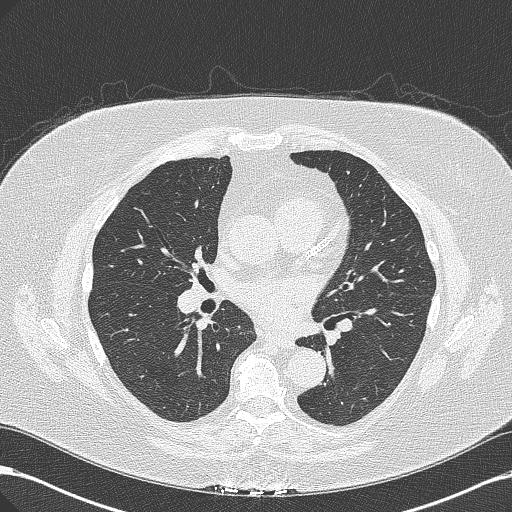
[im 23/31  lung]
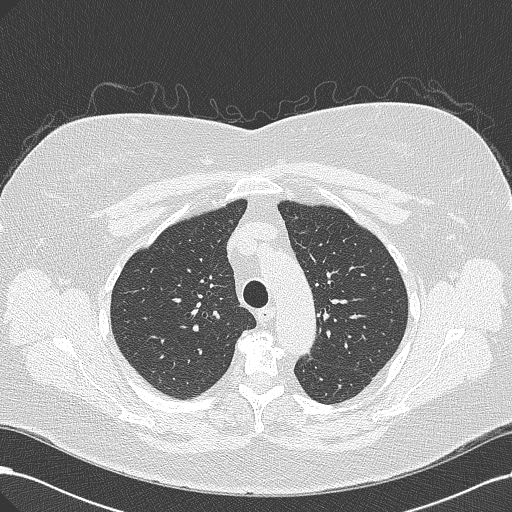

[Series 602: cor · coronal · 0.70mm/px · 3 of 120 slices shown]
[im 24/120  lung]
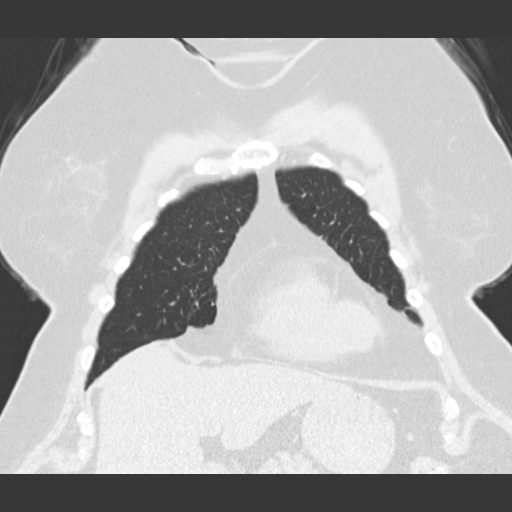
[im 48/120  lung]
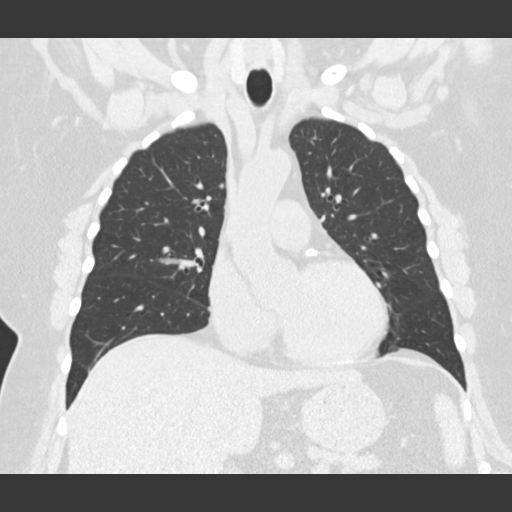
[im 72/120  lung]
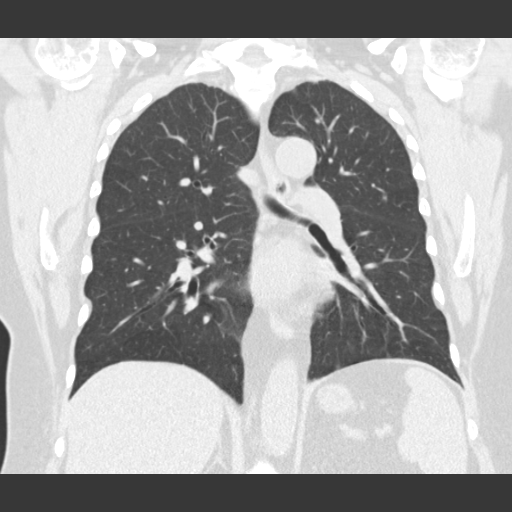

[13 of 36 positions shown; findings below may reference images not displayed]

FINDINGS: Mediastinum/Lymph Nodes: Heart size is normal. There is no
significant pericardial fluid, thickening or pericardial
calcification. There is atherosclerosis of the thoracic aorta, the
great vessels of the mediastinum and the coronary arteries,
including calcified atherosclerotic plaque in the left main, left
anterior descending, left circumflex and right coronary arteries.
Left anterior descending coronary artery stent. No pathologically
enlarged mediastinal or hilar lymph nodes. Please note that accurate
exclusion of hilar adenopathy is limited on noncontrast CT scans.
Esophagus is unremarkable in appearance. No axillary
lymphadenopathy.

Lungs/Pleura: Focal expansion of extrapleural fat adjacent to the
inferior segment of the lingula incidentally noted, potentially a
pleural-based lipoma, or simply hypertrophy related to an adjacent
area of parenchymal scarring. No acute consolidative airspace
disease. No pleural effusions. Linear scarring in the left lower
lobe. High-resolution images demonstrate no significant ground-glass
attenuation, subpleural reticulation or frank honeycombing.
Scattered areas of mild cylindrical bronchiectasis are noted, most
evident in the left lower lobe. Inspiratory and expiratory imaging
demonstrates extensive air trapping, indicative of small airways
disease.

Upper Abdomen: Unremarkable.

Musculoskeletal/Soft Tissues: There are no aggressive appearing
lytic or blastic lesions noted in the visualized portions of the
skeleton.
IMPRESSION: 1. No findings to suggest interstitial lung disease.
2. Areas of mild cylindrical bronchiectasis are noted throughout the
lungs bilaterally, most evident in the left lower lobe.
3. Atherosclerosis, including left main and 3 vessel coronary artery
disease. Status post LAD stent placement.
4. Additional incidental findings, as above.
